# Patient Record
Sex: Female | Born: 1970 | Race: Black or African American | Hispanic: No | Marital: Married | Smoking: Never smoker
Health system: Southern US, Community
[De-identification: ages and names within clinical notes are randomized; demographics above are authoritative.]

---

## 1998-06-03 ENCOUNTER — Other Ambulatory Visit: Admission: RE | Admit: 1998-06-03 | Discharge: 1998-06-03 | Payer: Self-pay | Admitting: Obstetrics and Gynecology

## 2007-02-01 ENCOUNTER — Inpatient Hospital Stay (HOSPITAL_COMMUNITY): Admission: RE | Admit: 2007-02-01 | Discharge: 2007-02-03 | Payer: Self-pay | Admitting: Obstetrics and Gynecology

## 2009-01-27 ENCOUNTER — Ambulatory Visit: Payer: Self-pay | Admitting: Diagnostic Radiology

## 2009-01-27 ENCOUNTER — Ambulatory Visit (HOSPITAL_BASED_OUTPATIENT_CLINIC_OR_DEPARTMENT_OTHER): Admission: RE | Admit: 2009-01-27 | Discharge: 2009-01-27 | Payer: Self-pay | Admitting: Family Medicine

## 2011-01-26 ENCOUNTER — Other Ambulatory Visit (HOSPITAL_BASED_OUTPATIENT_CLINIC_OR_DEPARTMENT_OTHER): Payer: Self-pay | Admitting: Obstetrics and Gynecology

## 2011-01-26 DIAGNOSIS — Z1231 Encounter for screening mammogram for malignant neoplasm of breast: Secondary | ICD-10-CM

## 2011-02-19 ENCOUNTER — Ambulatory Visit (HOSPITAL_BASED_OUTPATIENT_CLINIC_OR_DEPARTMENT_OTHER)
Admission: RE | Admit: 2011-02-19 | Discharge: 2011-02-19 | Disposition: A | Discharge status: Home / Self Care | Payer: BC Managed Care – PPO | Source: Ambulatory Visit | Attending: Obstetrics and Gynecology | Admitting: Obstetrics and Gynecology

## 2011-02-19 DIAGNOSIS — Z1231 Encounter for screening mammogram for malignant neoplasm of breast: Secondary | ICD-10-CM | POA: Insufficient documentation

## 2011-03-05 NOTE — H&P (Signed)
Shelly Williamson, [place]              ACCOUNT NO.:  [id number]   MEDICAL RECORD NO.:  [id number]          PATIENT TYPE:  MAT   LOCATION:  [facility]                          FACILITY:  [facility]   PHYSICIAN:  Lenoard Aden, M.D.DATE OF BIRTH:  29-Oct-1970   DATE OF ADMISSION:  02/01/2007  DATE OF DISCHARGE:                              HISTORY & PHYSICAL   CHIEF COMPLAINT:  Post date SGA for induction.   She is a 40 year old African-American female, G 7, P 1, EDD of  01/26/2007 at 40-5/7 weeks' gestation, for induction.  She has no known  drug allergies.  Medications are prenatal vitamins.   FAMILY HISTORY:  Diabetes, hypertension,   PERSONAL HISTORY:  Galactorrhea, nasal surgery, vaginal delivery x1,  history of missed AB x4.  Pregnancy course completed by GBS positivity.  She is a nonsmoker, nondrinker.  She denies domestic physical violence.  Most recent ultrasound was performed on 01/26/2007 with a estimated  fetal weight of 6 pounds 12 ounces and AFI of 24.   PHYSICAL EXAMINATION:  GENERAL:  She is a well-developed, well-nourished  African-American female in no acute distress.  HEENT:  Normal.  LUNGS:  Clear.  HEART:  Regular rhythm.  ABDOMEN:  Soft, gravid, nontender.  Estimated fetal weight 6-1/2 to 7  pounds.  PELVIC:  Cervix is 2 cm, 50%, vertex, minus 2.  EXTREMITIES:  There are no cords.  NEUROLOGIC:  Nonfocal.   IMPRESSION:  1. A 41-week intrauterine pregnancy.  2. Group B strep positive.   PLAN:  Penicillin prophylaxis.  Pitocin.  Anticipate attempted vaginal  delivery __________      Lenoard Aden, M.D.  Electronically Signed     RJT/MEDQ  D:  01/31/2007  T:  01/31/2007  Job:  [id number]

## 2011-03-05 NOTE — Op Note (Signed)
Shelly Williamson, Shelly Williamson              ACCOUNT NO.:  [id number]   MEDICAL RECORD NO.:  [id number]          PATIENT TYPE:  INP   LOCATION:  9162                          FACILITY:  [facility]   PHYSICIAN:  Lenoard Aden, M.D.DATE OF BIRTH:  21-May-1971   DATE OF PROCEDURE:  02/01/2007  DATE OF DISCHARGE:                               OPERATIVE REPORT   INDICATION FOR OPERATIVE DELIVERY:  Non reassuring fetal heart tracings.   POSTOPERATIVE DIAGNOSIS:  Non reassuring fetal heart tracings   PROCEDURE:  Low vacuum assisted vaginal delivery with Kiwi cup.   ANESTHESIA:  Local.   ESTIMATED BLOOD LOSS:  300 cubic centimeters.   COMPLICATIONS:  None.   DRAINS:  Straight catheter with red rubber catheter.   DISPOSITION:  The patient to recovery in good condition.   FINDINGS:  Full term living female fetus.  Apgars 8 and 9.  Pediatrician  is in attendance.  Cord pH and cord blood collected.  The placenta was  spontaneously intact with three vessel cord.  Left periurethral  laceration with repair with 3-0 Rapid under local anesthesia.   DESCRIPTION OF THE PROCEDURE:  Being apprised of the risks and benefits  of vacuum assistance, a small incidence of cephalohematoma, scalp  laceration, and intracranial hemorrhage, a Kiwi cup was placed fetal  vertex occiput anterior plus 2/plus 3 station for three pulls, two pop-  offs, full term living female over left periurethral laceration, bulb  suction done.  Apgars 8 and 9.  Pediatrician sends cord; pH and cord  blood collected.  Placenta spontaneously intact, three vessel cord  noted.  Repair with two interrupted sutures of 3-0 Rapid on the left  periurethral laceration noted.  No cervical or vaginal lacerations  noted.  The patient tolerated the procedure well.  Mother and baby  recovering in good condition.      Lenoard Aden, M.D.  Electronically Signed     RJT/MEDQ  D:  02/01/2007  T:  02/01/2007  Job:  [id number]

## 2012-02-01 ENCOUNTER — Other Ambulatory Visit (HOSPITAL_BASED_OUTPATIENT_CLINIC_OR_DEPARTMENT_OTHER): Payer: Self-pay | Admitting: Obstetrics and Gynecology

## 2012-02-01 DIAGNOSIS — Z1231 Encounter for screening mammogram for malignant neoplasm of breast: Secondary | ICD-10-CM

## 2012-02-23 ENCOUNTER — Ambulatory Visit (HOSPITAL_BASED_OUTPATIENT_CLINIC_OR_DEPARTMENT_OTHER)
Admission: RE | Admit: 2012-02-23 | Discharge: 2012-02-23 | Disposition: A | Discharge status: Home / Self Care | Payer: BC Managed Care – PPO | Source: Ambulatory Visit | Attending: Obstetrics and Gynecology | Admitting: Obstetrics and Gynecology

## 2012-02-23 DIAGNOSIS — Z1231 Encounter for screening mammogram for malignant neoplasm of breast: Secondary | ICD-10-CM | POA: Insufficient documentation

## 2013-01-24 ENCOUNTER — Other Ambulatory Visit (HOSPITAL_BASED_OUTPATIENT_CLINIC_OR_DEPARTMENT_OTHER): Payer: Self-pay | Admitting: Obstetrics and Gynecology

## 2013-01-24 DIAGNOSIS — Z1231 Encounter for screening mammogram for malignant neoplasm of breast: Secondary | ICD-10-CM

## 2013-02-28 ENCOUNTER — Ambulatory Visit (HOSPITAL_BASED_OUTPATIENT_CLINIC_OR_DEPARTMENT_OTHER)
Admission: RE | Admit: 2013-02-28 | Discharge: 2013-02-28 | Disposition: A | Discharge status: Home / Self Care | Payer: BC Managed Care – PPO | Source: Ambulatory Visit | Attending: Obstetrics and Gynecology | Admitting: Obstetrics and Gynecology

## 2013-02-28 DIAGNOSIS — Z1231 Encounter for screening mammogram for malignant neoplasm of breast: Secondary | ICD-10-CM

## 2014-01-24 ENCOUNTER — Other Ambulatory Visit (HOSPITAL_BASED_OUTPATIENT_CLINIC_OR_DEPARTMENT_OTHER): Payer: Self-pay | Admitting: Obstetrics and Gynecology

## 2014-01-24 DIAGNOSIS — Z1231 Encounter for screening mammogram for malignant neoplasm of breast: Secondary | ICD-10-CM

## 2014-03-01 ENCOUNTER — Ambulatory Visit (HOSPITAL_BASED_OUTPATIENT_CLINIC_OR_DEPARTMENT_OTHER)
Admission: RE | Admit: 2014-03-01 | Discharge: 2014-03-01 | Disposition: A | Discharge status: Home / Self Care | Payer: BC Managed Care – PPO | Source: Ambulatory Visit | Attending: Obstetrics and Gynecology | Admitting: Obstetrics and Gynecology

## 2014-03-01 DIAGNOSIS — Z1231 Encounter for screening mammogram for malignant neoplasm of breast: Secondary | ICD-10-CM | POA: Insufficient documentation

## 2019-11-12 ENCOUNTER — Other Ambulatory Visit: Payer: Self-pay | Admitting: Obstetrics and Gynecology

## 2019-11-12 DIAGNOSIS — N6310 Unspecified lump in the right breast, unspecified quadrant: Secondary | ICD-10-CM

## 2019-12-14 ENCOUNTER — Ambulatory Visit
Admission: RE | Admit: 2019-12-14 | Discharge: 2019-12-14 | Disposition: A | Discharge status: Home / Self Care | Payer: Managed Care, Other (non HMO) | Source: Ambulatory Visit | Attending: Obstetrics and Gynecology | Admitting: Obstetrics and Gynecology

## 2019-12-14 ENCOUNTER — Other Ambulatory Visit: Payer: Self-pay

## 2019-12-14 DIAGNOSIS — N6310 Unspecified lump in the right breast, unspecified quadrant: Secondary | ICD-10-CM

## 2020-12-24 IMAGING — US US BREAST*R* LIMITED INC AXILLA
1 series · 5 of 5 positions shown · non-contrast
Comparison: Previous exam(s).

CLINICAL DATA: The palpable lump right breast

EXAM:
DIGITAL DIAGNOSTIC right MAMMOGRAM WITH CAD AND TOMO
ULTRASOUND right BREAST

[Series 1: us breast*right* limited inc axilla · 0.07mm/px · 5 of 5 slices shown]
[im 1/5]
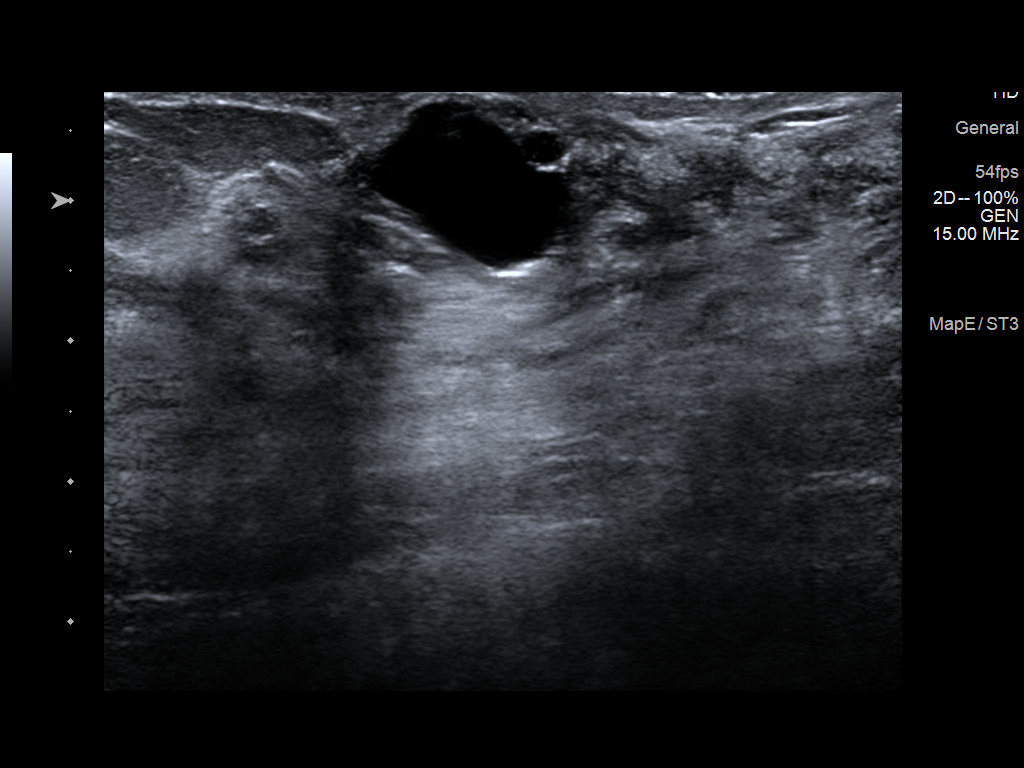
[im 2/5]
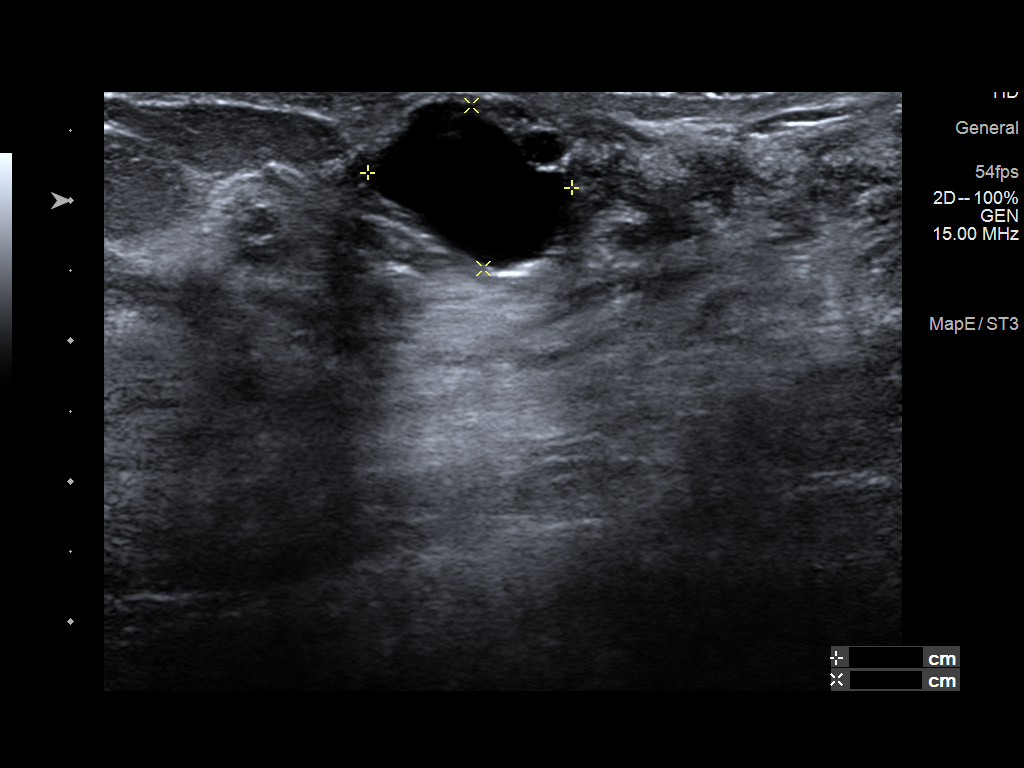
[im 3/5]
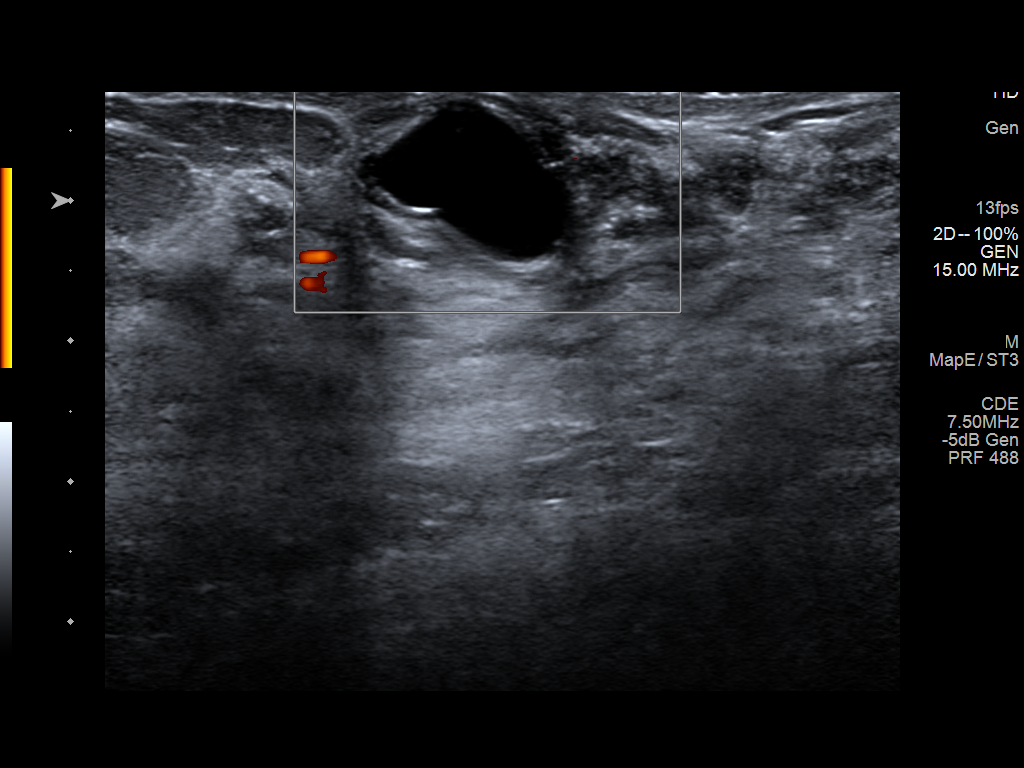
[im 4/5]
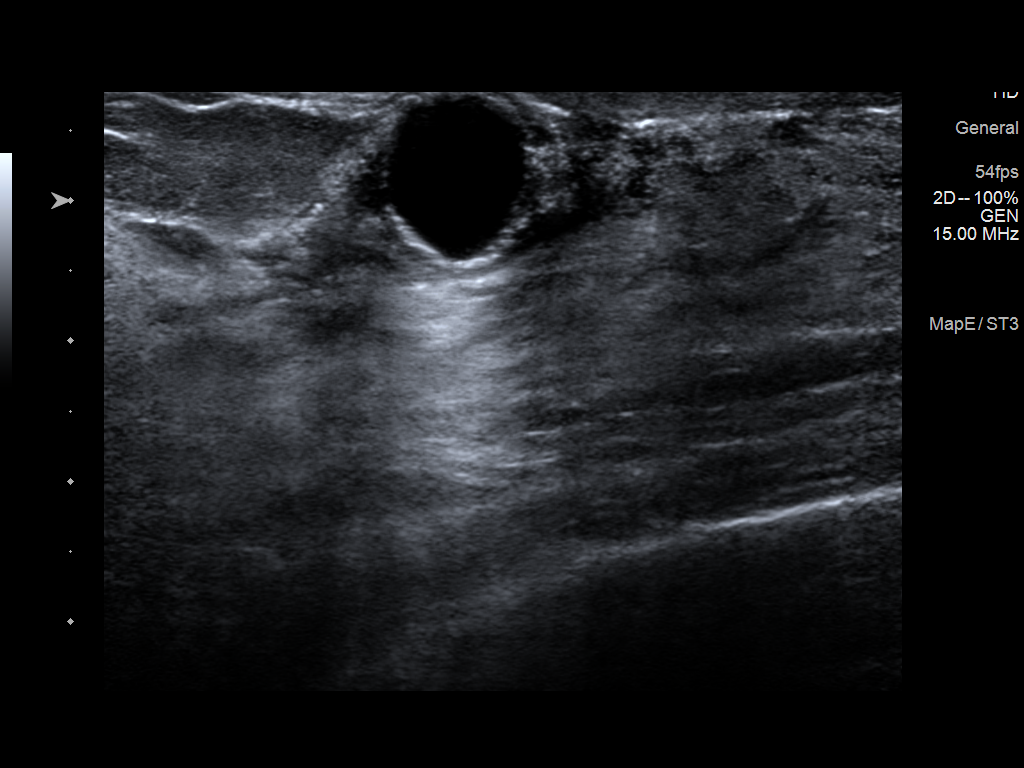
[im 5/5]
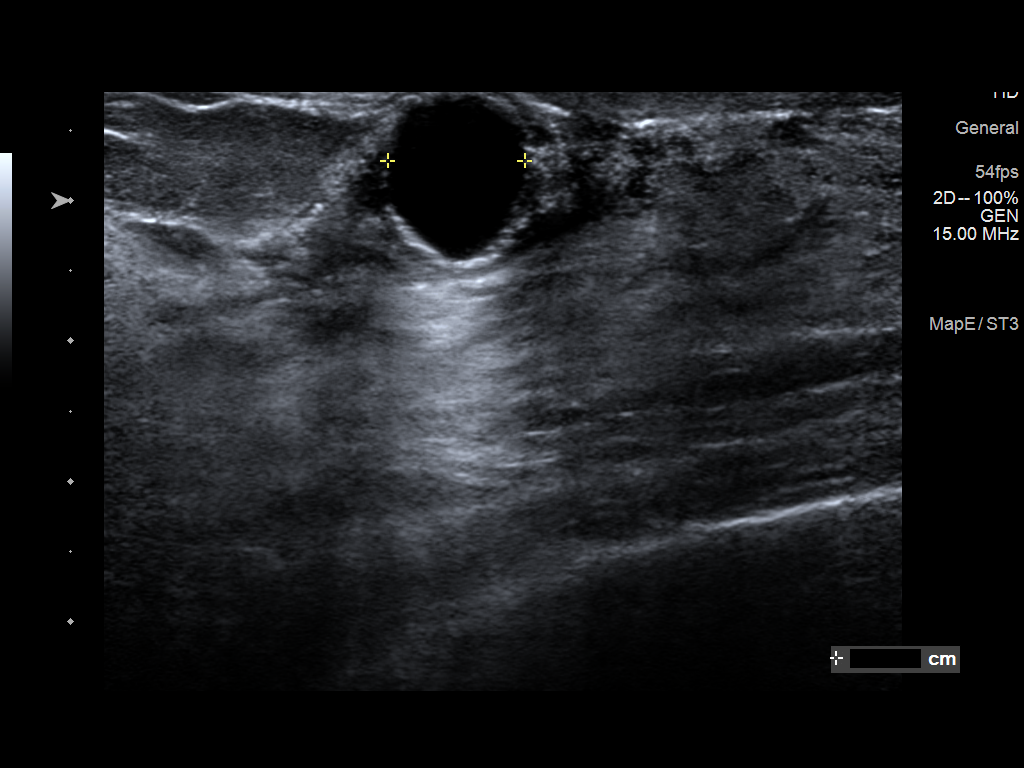

[5 of 5 positions shown; findings below may reference images not displayed]

ACR Breast Density Category d: The breast tissue is extremely dense,
which lowers the sensitivity of mammography.
FINDINGS: Cc and MLO views the right breast, spot tangential right. No
suspicious right breast. Breast parenchyma is unchanged compared
prior exams.

Mammographic images were processed with CAD.

Targeted ultrasound is performed, showing 1.5 cm simple cyst at
palpable area right breast 9 o'clock subareolar region.
IMPRESSION: Benign findings.

RECOMMENDATION:
Routine screening mammogram back on schedule.

I have discussed the findings and recommendations with the patient.
If applicable, a reminder letter will be sent to the patient
regarding the next appointment.

BI-RADS CATEGORY  2: Benign.

## 2022-05-21 ENCOUNTER — Encounter: Payer: Self-pay | Admitting: Internal Medicine

## 2022-05-21 ENCOUNTER — Ambulatory Visit (INDEPENDENT_AMBULATORY_CARE_PROVIDER_SITE_OTHER): Payer: 59 | Admitting: Internal Medicine

## 2022-05-21 VITALS — BP 128/76 | HR 98 | Temp 98.0°F | Resp 17 | Ht 67.5 in | Wt 212.8 lb

## 2022-05-21 DIAGNOSIS — J310 Chronic rhinitis: Secondary | ICD-10-CM

## 2022-05-21 DIAGNOSIS — T50B95A Adverse effect of other viral vaccines, initial encounter: Secondary | ICD-10-CM | POA: Diagnosis not present

## 2022-05-21 DIAGNOSIS — W57XXXA Bitten or stung by nonvenomous insect and other nonvenomous arthropods, initial encounter: Secondary | ICD-10-CM

## 2022-05-21 DIAGNOSIS — J3089 Other allergic rhinitis: Secondary | ICD-10-CM | POA: Diagnosis not present

## 2022-05-21 DIAGNOSIS — H1013 Acute atopic conjunctivitis, bilateral: Secondary | ICD-10-CM

## 2022-05-21 DIAGNOSIS — L500 Allergic urticaria: Secondary | ICD-10-CM

## 2022-05-21 MED ORDER — HYDROCORTISONE 2.5 % EX OINT: TOPICAL_OINTMENT | CUTANEOUS | 3 refills | Status: AC

## 2022-05-21 NOTE — Progress Notes (Signed)
NEW PATIENT Date of Service/Encounter:  05/21/22 Referring provider: none-self referred Primary care provider: Loyal Jacobson, MD  Subjective:  Shelly Williamson is a 51 y.o. female with a PMHx of obesity, iron deficiency anemia, vitamin B12 and D deficiency, essential hypertension presenting today for evaluation of bug bites. History obtained from: chart review and patient.   Received COVID injection on June 23rd.  She broke out into blisters on her arm.  Blisters appears on both arms and not in the area of the vaccine injection spot.  Started about a week after getting the vaccine.  Blisters lasted for another week. She now has dark spots from where they resolved. Never had blistery rash in the past.   Brought home bedbugs from the beach when she went a few weeks ago. She has bite marks on her back.  Bedbugs found in home which is now being treated.  She does have several hyperpigmented spots on her arms and legs. She seems to have bad reactions to any type of bug bites.  Always localized swelling.When she gets mosquito bites, she will get big welts.  Denies other systemic symptoms.  Chronic rhinitis-occasionally eye watery/itchy eyes, and runny nose.  Overall symptoms are very mild.  She would be interested in knowing if environmental allergens are triggering this.  Other allergy screening: Asthma: no Rhino conjunctivitis: no Food allergy: no Medication allergy: no Urticaria: no Eczema:no  Past Medical History: History reviewed. No pertinent past medical history. Medication List:  Current Outpatient Medications  Medication Sig Dispense Refill   ascorbic acid (VITAMIN C) 500 MG tablet Take 1 tablet by mouth daily.     Cholecalciferol 50 MCG (2000 UT) CAPS Take by mouth.     Cyanocobalamin 1000 MCG SUBL Place under the tongue.     ferrous sulfate 325 (65 FE) MG EC tablet Take 1 tablet by mouth daily.     fluticasone (FLONASE) 50 MCG/ACT nasal spray Administer 1-2 sprays in each  nostril daily as needed for Allergies.     tranexamic acid (LYSTEDA) 650 MG TABS tablet TAKE 2 TABLETS BY MOUTH THREE TIMES DAILY AS NEEDED     valsartan-hydrochlorothiazide (DIOVAN-HCT) 320-25 MG tablet Take 1 tablet by mouth daily.     No current facility-administered medications for this visit.   Known Allergies:  Not on File Past Surgical History: History reviewed. No pertinent surgical history. Family History: Family History  Problem Relation Age of Onset   Allergic rhinitis Daughter    Allergic rhinitis Son    Social History: [place] lives in a house built 30 years ago, no water damage, carpet floors, central AC, heat pump gas, no cockroaches, not using dust mite protection on the bedding, using dust mite protection on the pillows, works in Audiological [employer].  No HEPA filter in the home.  [place]   ROS:  All other systems negative except as noted per HPI.  Objective:  Blood pressure 128/76, pulse 98, temperature 98 F (36.7 C), temperature source Temporal, resp. rate 17, height 5' 7.5" (1.715 m), weight 212 lb 12.8 oz (96.5 kg), SpO2 98 %. Body mass index is 32.84 kg/m. Physical Exam:  General Appearance:  Alert, cooperative, no distress, appears stated age  Head:  Normocephalic, without obvious abnormality, atraumatic  Eyes:  Conjunctiva clear, EOM's intact  Nose: Nares normal, normal mucosa and no visible anterior polyps  Throat: Lips, tongue normal; teeth and gums normal, normal posterior oropharynx  Neck: Supple, symmetrical  Lungs:   clear to auscultation  bilaterally, Respirations unlabored, no coughing  Heart:  regular rate and rhythm and no murmur, Appears well perfused  Extremities: No edema  Skin: Skin color, texture, turgor normal, hyperpigmented macules scattered on upper and lower extremities as well as back  Neurologic: No gross deficits     Diagnostics: Skin Testing: Environmental allergy panel.  Adequate controls. Results  discussed with patient/family.  Airborne Adult Perc - 05/21/22 1105     Time Antigen Placed 1105    Allergen Manufacturer Greer    Location Back    Number of Test 59    Panel 1 Select    1. Control-Buffer 50% Glycerol Negative    2. Control-Histamine 1 mg/ml 3+    3. Albumin saline Negative    4. [place] Negative    5. [place] Negative    6. [place] Negative    7. [place] Blue Negative    8. Meadow Fescue Negative    9. Perennial Rye Negative    10. Sweet Vernal Negative    11. Timothy Negative    12. Cocklebur Negative    13. Burweed Marshelder Negative    14. Ragweed, short Negative    15. Ragweed, Giant Negative    16. Plantain,  English Negative    17. Lamb's Quarters Negative    18. Sheep Sorrell Negative    19. Rough Pigweed Negative    20. Marsh Elder, Rough Negative    21. Mugwort, Common Negative    22. Ash mix Negative    23. Birch mix Negative    24. Beech American Negative    25. Box, Elder Negative    26. Cedar, red Negative    27. Cottonwood, [place] Negative    28. Elm mix Negative    29. Hickory Negative    30. Maple mix Negative    31. Oak, [place] mix Negative    32. Pecan Pollen Negative    33. Pine mix Negative    34. Sycamore Eastern Negative    35. [place], Black Pollen Negative    36. Alternaria alternata 3+    37. Cladosporium Herbarum Negative    38. Aspergillus mix Negative    39. Penicillium mix Negative    40. Bipolaris sorokiniana (Helminthosporium) Negative    41. Drechslera spicifera (Curvularia) Negative    42. Mucor plumbeus Negative    43. Fusarium moniliforme Negative    44. Aureobasidium pullulans (pullulara) Negative    45. Rhizopus oryzae Negative    46. Botrytis cinera Negative    47. Epicoccum nigrum Negative    48. Phoma betae Negative    49. Candida Albicans Negative    50. Trichophyton mentagrophytes Negative    51. Mite, D Farinae  5,000 AU/ml Negative    52. Mite, D Pteronyssinus  5,000 AU/ml Negative    53. Cat  Hair 10,000 BAU/ml Negative    54.  Dog Epithelia Negative    55. Mixed Feathers Negative    56. Horse Epithelia Negative    57. Cockroach, German Negative    58. Mouse Negative    59. Tobacco Leaf Negative             Allergy testing results were read and interpreted by myself, documented by clinical staff.  Assessment and Plan  Mosquito/insect bites Avoidance measures (DEET repellant for mosquitos, long clothing, etc) If bite occurs with raised rash:  - For itch: Topical steroid (hydrocortisone cream) twice daily as needed + oral antihistamine (zyrtec) - For pain and swelling: Oral anti-inflammatory (  ibuprofen), ice affected area       Possible Reaction to COVID-19 vaccine-delayed bullous reaction - since this was a non-severe delayed reaction, no strict contraindication to getting repeat vaccination -However, no guarantee that this will not occur again -Unfortunately there is no specific allergy testing for delayed reactions  Chronic Rhinitis -perennial allergic: - allergy testing today was positive to outdoor mold (alternaria) only - allergen avoidance as below - Consider over the counter antihistamine daily or daily as needed.   -Your options include Zyrtec (Cetirizine) 10mg , Claritin (Loratadine) 10mg , Allegra (Fexofenadine) 180mg , or Xyzal (Levocetirinze) 5mg   Allergic Conjunctivitis:  - Consider Allergy Eye drops: great options include Pataday (Olopatadine) or Zaditor (ketotifen) for eye symptoms daily as needed-both sold over the counter if not covered by insurance.   -Avoid eye drops that say red eye relief as they may contain medications that dry out your eyes.  Follow-up as needed.  It was a pleasure meeting you in clinic today!  , MD [facility]   This note in its entirety was forwarded to the Provider who requested this consultation.  Thank you for your kind referral. I appreciate the opportunity to take part in Amiaya's  care. Please do not hesitate to contact me with questions.  Sincerely,  , MD [facility]

## 2022-05-21 NOTE — Patient Instructions (Signed)
Mosquito/insect bites Avoidance measures (DEET repellant for mosquitos, long clothing, etc) If bite occurs with raised rash:  - For itch: Topical steroid (hydrocortisone cream) twice daily as needed + oral antihistamine (zyrtec) - For pain and swelling: Oral anti-inflammatory (ibuprofen), ice affected area       Possible Reaction to COVID-19 vaccine-delayed bullous reaction - since this was a non-severe delayed reaction, no strict contraindication to getting repeat vaccination -However, no guarantee that this will not occur again -Unfortunately there is no specific allergy testing for delayed reactions  Chronic Rhinitis -perennial allergic: - allergy testing today was positive to outdoor mold (alternaria) only - allergen avoidance as below - Consider over the counter antihistamine daily or daily as needed.   -Your options include Zyrtec (Cetirizine) 10mg , Claritin (Loratadine) 10mg , Allegra (Fexofenadine) 180mg , or Xyzal (Levocetirinze) 5mg   Allergic Conjunctivitis:  - Consider Allergy Eye drops: great options include Pataday (Olopatadine) or Zaditor (ketotifen) for eye symptoms daily as needed-both sold over the counter if not covered by insurance.   -Avoid eye drops that say red eye relief as they may contain medications that dry out your eyes.  Follow-up as needed.  It was a pleasure meeting you in clinic today!  , MD [facility]

## 2022-05-27 ENCOUNTER — Ambulatory Visit: Payer: 59 | Admitting: Internal Medicine
